# Patient Record
Sex: Male | Born: 1935
Health system: Southern US, Community
[De-identification: ages and names within clinical notes are randomized; demographics above are authoritative.]

---

## 2017-01-06 ENCOUNTER — Emergency Department (HOSPITAL_COMMUNITY): Payer: Medicare Other

## 2017-01-06 ENCOUNTER — Emergency Department (HOSPITAL_COMMUNITY)
Admission: EM | Admit: 2017-01-06 | Discharge: 2017-01-07 | Disposition: A | Discharge status: Home / Self Care | Payer: Medicare Other | Attending: Emergency Medicine | Admitting: Emergency Medicine

## 2017-01-06 DIAGNOSIS — R4182 Altered mental status, unspecified: Secondary | ICD-10-CM | POA: Insufficient documentation

## 2017-01-06 DIAGNOSIS — Y939 Activity, unspecified: Secondary | ICD-10-CM | POA: Insufficient documentation

## 2017-01-06 DIAGNOSIS — T50901A Poisoning by unspecified drugs, medicaments and biological substances, accidental (unintentional), initial encounter: Secondary | ICD-10-CM | POA: Insufficient documentation

## 2017-01-06 DIAGNOSIS — Y999 Unspecified external cause status: Secondary | ICD-10-CM | POA: Insufficient documentation

## 2017-01-06 DIAGNOSIS — Z79899 Other long term (current) drug therapy: Secondary | ICD-10-CM

## 2017-01-06 DIAGNOSIS — W19XXXA Unspecified fall, initial encounter: Secondary | ICD-10-CM | POA: Diagnosis not present

## 2017-01-06 DIAGNOSIS — Y929 Unspecified place or not applicable: Secondary | ICD-10-CM | POA: Diagnosis not present

## 2017-01-06 NOTE — ED Triage Notes (Signed)
[facility] and [facility]. Pt LSN at 2000 by staff; pt found face down on floor. No lacs or bleeding noted. Per EMS pt baseline A&O; hx dementia and DM. Pt snoring; responding to painful stimuli. resp e/u. MD at bedside.

## 2017-01-06 NOTE — ED Notes (Addendum)
Pt given Seroquil, Ativan and Haldol per EMS by facility. Pt started mumbling some words when rectal temperature was taken. Otherwise responsive to pain.

## 2017-01-06 NOTE — ED Provider Notes (Addendum)
[facility]-EMERGENCY DEPT Provider Note   CSN: [id number] Arrival date & time: 01/06/17  2303  By signing my name below, I, Cynda Acres, attest that this documentation has been prepared under the direction and in the presence of Yardley Beltran, MD. Electronically Signed: Cynda Acres, Scribe. 01/06/17. 11:15 PM.  History   Chief Complaint Chief Complaint  Patient presents with  . Altered Mental Status  . Fall   LEVEL 5 CAVEAT DUE TO ALTERED MENTAL STATUS  HPI Comments: Bradley Hawkins is a 82 y.o. male with a history of dementia, who presents to the Emergency Department via EMS, for an unwitnessed mechanical fall that happened earlier today. Patient was last seen normal at 8 PM tonight. Patient is typically alert and at baseline. Per EMS, the patient was found face down on the floor. Per documentation from the facility the patient was given ativan, Seroquel, and haldol. Patient snoring in the room.   The history is provided by the EMS personnel. No language interpreter was used.  Altered Mental Status   This is a new problem. The current episode started 3 to 5 hours ago. The problem has not changed since onset.Associated symptoms include somnolence. Associated symptoms comments: After getting haldol, seroquel, ativan and remeron at the same time.  . His past medical history is significant for dementia and psychotropic medication treatment.  Fall  This is a new problem. The current episode started 3 to 5 hours ago.    No past medical history on file.  There are no active problems to display for this patient.   No past surgical history on file.     Home Medications    Prior to Admission medications   Not on File    Family History No family history on file.  Social History Social History  Substance Use Topics  . Smoking status: Not on file  . Smokeless tobacco: Not on file  . Alcohol use Not on file     Allergies   Patient has no allergy information on record.   Review  of Systems Review of Systems  Unable to perform ROS: Dementia     Physical Exam Updated Vital Signs SpO2 100%   Physical Exam  Constitutional: He appears well-developed and well-nourished. No distress.  HENT:  Head: Normocephalic and atraumatic. Head is without raccoon's eyes and without Battle's sign.  Mouth/Throat: Oropharynx is clear and moist. No oropharyngeal exudate.  No racoon eyes. Skull is stable.   Eyes: Conjunctivae are normal. Pupils are equal, round, and reactive to light.  Neck: Normal range of motion. Neck supple. No JVD present. No tracheal deviation present.  Cardiovascular: Normal rate, regular rhythm, normal heart sounds and intact distal pulses.  Exam reveals no gallop and no friction rub.   No murmur heard. Pulmonary/Chest: Effort normal and breath sounds normal. No stridor. He has no wheezes. He has no rales.  Abdominal: Soft. Bowel sounds are normal. He exhibits no mass. There is no tenderness. There is no rebound and no guarding.  Genitourinary:  Genitourinary Comments: Pelvis is stable.   Musculoskeletal: Normal range of motion. He exhibits no tenderness.  No foreshortening or external rotation. Intact distal pulses.   Lymphadenopathy:    He has no cervical adenopathy.  Neurological: He is alert. He displays normal reflexes. No cranial nerve deficit. He exhibits normal muscle tone. Coordination normal.  Skin: Skin is warm and dry. Capillary refill takes less than 2 seconds.     ED Treatments / Results   Vitals:  01/07/17 0300 01/07/17 0315  BP: 121/61 125/66  Pulse: 73 68  Resp: 12 13  Temp:      DIAGNOSTIC STUDIES: Oxygen Saturation is 100% on RA, normal by my interpretation.    COORDINATION OF CARE: 11:16 PM Discussed treatment plan with pt at bedside and pt agreed to plan, which includes imaging.    Radiology No results found for this or any previous visit. Ct Head Wo Contrast  Result Date: 01/07/2017 CLINICAL DATA:  Ischial  evaluation for acute fall, found down. EXAM: CT HEAD WITHOUT CONTRAST CT CERVICAL SPINE WITHOUT CONTRAST TECHNIQUE: Multidetector CT imaging of the head and cervical spine was performed following the standard protocol without intravenous contrast. Multiplanar CT image reconstructions of the cervical spine were also generated. COMPARISON:  None. FINDINGS: CT HEAD FINDINGS Brain: Generalized age-related cerebral atrophy with mild chronic small vessel ischemic disease. No acute intracranial hemorrhage. No evidence for acute infarct. No mass lesion, midline shift or mass effect. No hydrocephalus. No extra-axial fluid collection. Vascular: No hyperdense vessel. Scattered intracranial atherosclerosis noted. Skull: Scalp soft tissues demonstrate no acute abnormality. Calvarium intact. Sinuses/Orbits: Globes and orbital soft tissues within normal limits. Patient is status post lens extraction bilaterally. Mild scattered mucosal thickening within the frontoethmoidal sinuses. Paranasal sinuses are otherwise clear. No mastoid effusion. Other: No other significant finding. CT CERVICAL SPINE FINDINGS Alignment: Mild levoscoliosis. Trace anterolisthesis of C7 on T1. Vertebral bodies otherwise normally aligned. Skull base and vertebrae: Skullbase intact. Normal C1-2 articulations preserved. Dens is intact. Vertebral body heights maintained. No acute fracture. Soft tissues and spinal canal: Visualized soft tissues of the neck demonstrate no acute abnormality. Prevertebral soft tissues within normal limits. 13 mm right thyroid nodule noted, of doubtful significance. Disc levels: Advanced multilevel degenerative spondylolysis, greatest at C3-4 red area is near complete fusion. Multilevel facet arthropathy, also greatest at C3-4. Upper chest: Visualized upper chest demonstrates no acute abnormality. Irregular biapical pleural thickening. Visualized lungs are otherwise clear. IMPRESSION: CT BRAIN: 1. No acute intracranial process. 2.  Mild age-related cerebral atrophy with chronic small vessel ischemic disease. CT CERVICAL SPINE: 1. No acute traumatic injury within cervical spine. 2. Moderate to advanced multilevel degenerative spondylolysis and facet arthrosis, greatest at C3-4. Electronically Signed   By: Rise Mu M.D.   On: 01/07/2017 00:40   Ct Cervical Spine Wo Contrast  Result Date: 01/07/2017 CLINICAL DATA:  Ischial evaluation for acute fall, found down. EXAM: CT HEAD WITHOUT CONTRAST CT CERVICAL SPINE WITHOUT CONTRAST TECHNIQUE: Multidetector CT imaging of the head and cervical spine was performed following the standard protocol without intravenous contrast. Multiplanar CT image reconstructions of the cervical spine were also generated. COMPARISON:  None. FINDINGS: CT HEAD FINDINGS Brain: Generalized age-related cerebral atrophy with mild chronic small vessel ischemic disease. No acute intracranial hemorrhage. No evidence for acute infarct. No mass lesion, midline shift or mass effect. No hydrocephalus. No extra-axial fluid collection. Vascular: No hyperdense vessel. Scattered intracranial atherosclerosis noted. Skull: Scalp soft tissues demonstrate no acute abnormality. Calvarium intact. Sinuses/Orbits: Globes and orbital soft tissues within normal limits. Patient is status post lens extraction bilaterally. Mild scattered mucosal thickening within the frontoethmoidal sinuses. Paranasal sinuses are otherwise clear. No mastoid effusion. Other: No other significant finding. CT CERVICAL SPINE FINDINGS Alignment: Mild levoscoliosis. Trace anterolisthesis of C7 on T1. Vertebral bodies otherwise normally aligned. Skull base and vertebrae: Skullbase intact. Normal C1-2 articulations preserved. Dens is intact. Vertebral body heights maintained. No acute fracture. Soft tissues and spinal canal: Visualized soft tissues of the neck  demonstrate no acute abnormality. Prevertebral soft tissues within normal limits. 13 mm right thyroid  nodule noted, of doubtful significance. Disc levels: Advanced multilevel degenerative spondylolysis, greatest at C3-4 red area is near complete fusion. Multilevel facet arthropathy, also greatest at C3-4. Upper chest: Visualized upper chest demonstrates no acute abnormality. Irregular biapical pleural thickening. Visualized lungs are otherwise clear. IMPRESSION: CT BRAIN: 1. No acute intracranial process. 2. Mild age-related cerebral atrophy with chronic small vessel ischemic disease. CT CERVICAL SPINE: 1. No acute traumatic injury within cervical spine. 2. Moderate to advanced multilevel degenerative spondylolysis and facet arthrosis, greatest at C3-4. Electronically Signed   By: Rise Mu M.D.   On: 01/07/2017 00:40   Ct Hip Left Wo Contrast  Result Date: 01/07/2017 CLINICAL DATA:  Pain after fall, lucency noted along the left femoral head on same day hip radiographs EXAM: CT OF THE LEFT HIP WITHOUT CONTRAST TECHNIQUE: Multidetector CT imaging of the left hip was performed according to the standard protocol. Multiplanar CT image reconstructions were also generated. COMPARISON:  None. FINDINGS: Bones/Joint/Cartilage There is osteoarthritis of the left hip with subchondral degenerative cysts of the acetabulum and prominent rim of osteophytes at the femoral head- neck juncture simulating the lucency seen radiographically. The lucency is likely due to the cleft at the osteophyte-femoral head juncture. No bone destruction, significant joint effusion or intra-articular loose bodies. Small bone island of the left acetabulum. The visualized left SI joint and pubic rami are nonacute. Ligaments Suboptimally assessed by CT. Muscles and Tendons Negative Soft tissues Negative IMPRESSION: Osteoarthritis of the left hip. Prominent rim of osteophytes likely contributing to the lucency seen radiographically. No fracture or dislocation noted. Electronically Signed   By: Tollie Eth M.D.   On: 01/07/2017 01:30   Dg  Hips Bilat W Or Wo Pelvis 3-4 Views  Result Date: 01/07/2017 CLINICAL DATA:  Fall with bilateral hip pain EXAM: DG HIP (WITH OR WITHOUT PELVIS) 3-4V BILAT COMPARISON:  None. FINDINGS: Mild SI joint arthritis. Both femoral heads project in joint. Pubic symphysis is intact. No definite fracture on the right. Moderate bilateral hip arthritis. There is a lucency at the left femoral head neck junction IMPRESSION: 1. Lucency at the left femoral head neck junction, cannot exclude a subtle nondisplaced fracture. CT recommended for further evaluation 2. No definite acute osseous abnormality of the right hip. 3. Moderate arthritis of both hips Electronically Signed   By: Jasmine Pang M.D.   On: 01/07/2017 00:02    Procedures Procedures (including critical care time)   Patient now easily arousable and appropriate. This is a huge amount of medications for a person even one with dementia and sun downing.  I recommend stopping these meds as the patient is a fall risk.   Final Clinical Impressions(s) / ED Diagnoses  Polypharmacy causing fall and somnolence. Patient has stable vitals. Pertinent labs were available during my care of the patient were reviewed by me and considered in my medical decision making.  After history, exam, and medical workup I feel the patient has been appropriately medically screened and is safe for discharge home. Pertinent diagnoses were discussed with the patient. Patient was given return precautions. Return immediately for fevers, weakness, numbness, persistent somnolence, focal weakness continued intractable nausea and vomiting, shortness of breath, lightheadedness or any concerns. Stop those medications.  Follow up with your PMD for recheck in 2 days.    I personally performed the services described in this documentation, which was scribed in my presence. The recorded information has  been reviewed and is accurate.       Cy BlamerApril Linzee Depaul, MD 01/07/17 [id number]    Cy BlamerApril Trell Secrist,  MD 01/07/17 [phone]

## 2017-01-06 NOTE — ED Notes (Signed)
Patient transported to CT 

## 2017-01-07 ENCOUNTER — Emergency Department (HOSPITAL_COMMUNITY): Payer: Medicare Other

## 2017-01-07 MED ORDER — AMMONIA AROMATIC IN INHA
0.3000 mL | Freq: Once | RESPIRATORY_TRACT | Status: AC
  Administered 2017-01-07: 0.3 mL via RESPIRATORY_TRACT
  Filled 2017-01-07: qty 10

## 2017-01-07 MED ORDER — NALOXONE HCL 2 MG/2ML IJ SOSY: 2.0000 mg | PREFILLED_SYRINGE | Freq: Once | INTRAMUSCULAR | Status: DC

## 2017-01-07 MED ORDER — NALOXONE HCL 2 MG/2ML IJ SOSY
2.0000 mg | PREFILLED_SYRINGE | Freq: Once | INTRAMUSCULAR | Status: AC
  Administered 2017-01-07: 2 mg via INTRAVENOUS
  Filled 2017-01-07: qty 2

## 2017-01-07 NOTE — ED Notes (Signed)
Contacted facility, pt was given 1mg  Haldol, 1mg  Ativan, 75mg  Seroquil and 15mg  Remeron last night.

## 2017-01-07 NOTE — ED Notes (Signed)
[facility] Dur[facility] about [facility]. Family notified.

## 2017-01-07 NOTE — ED Notes (Signed)
Onalee Huaavid, pt's son would like to be contacted with any updates. [phone].

## 2017-01-07 NOTE — ED Notes (Signed)
Pt did not respond to ammonia inhalant.

## 2017-01-07 NOTE — ED Notes (Signed)
Patient transported to CT 

## 2018-05-25 IMAGING — CT CT HEAD W/O CM
3 of 4 series · 14 of 47 positions shown, 16 images · non-contrast
Comparison: None.

CLINICAL DATA: Ischial evaluation for acute fall, found down.

EXAM:
CT HEAD WITHOUT CONTRAST
CT CERVICAL SPINE WITHOUT CONTRAST
TECHNIQUE: Multidetector CT imaging of the head and cervical spine was
performed following the standard protocol without intravenous
contrast. Multiplanar CT image reconstructions of the cervical spine
were also generated.

[Series 3: c_spine 2.0 st · axial · 0.31mm/px · z∈[-331,-145]mm · 8 of 109 slices shown, 10 images]
[im 8/109  brain]
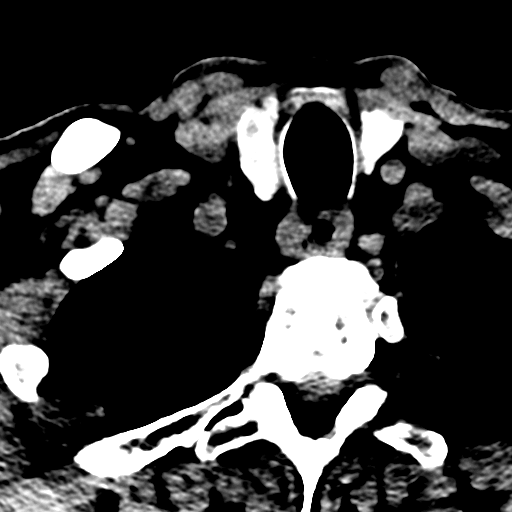
[im 8/109  bone]
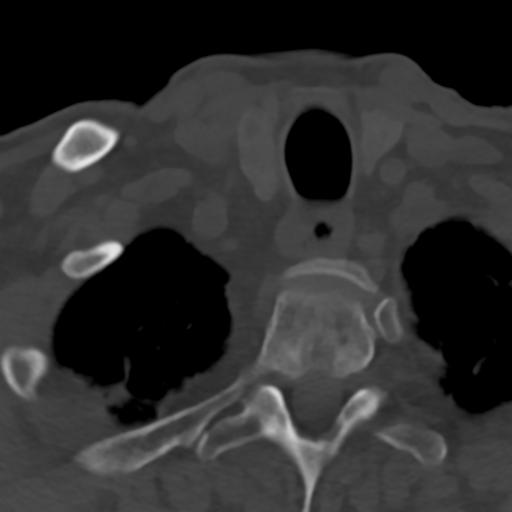
[im 24/109  brain]
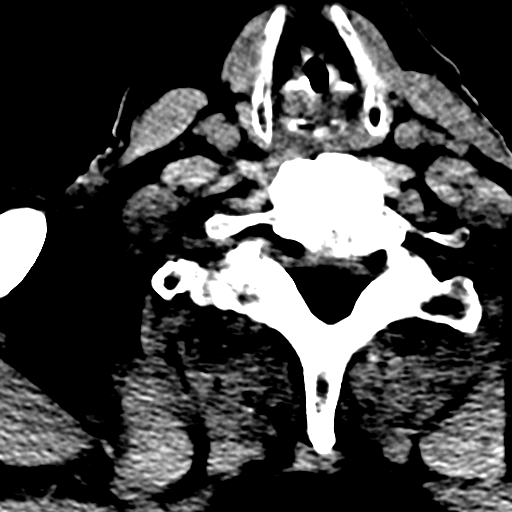
[im 39/109  brain]
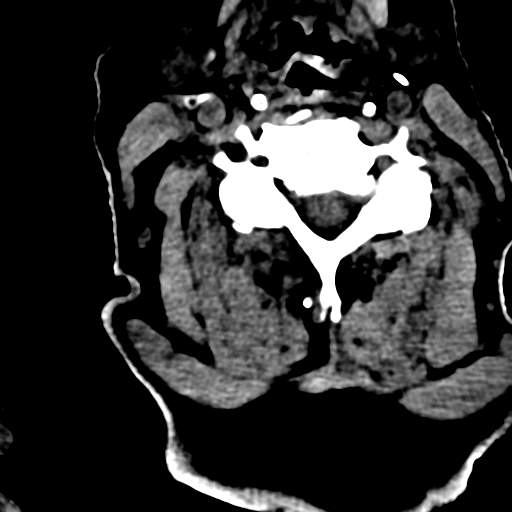
[im 47/109  brain]
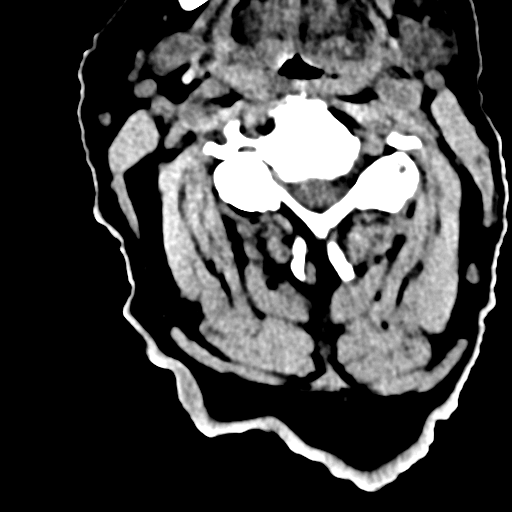
[im 62/109  brain]
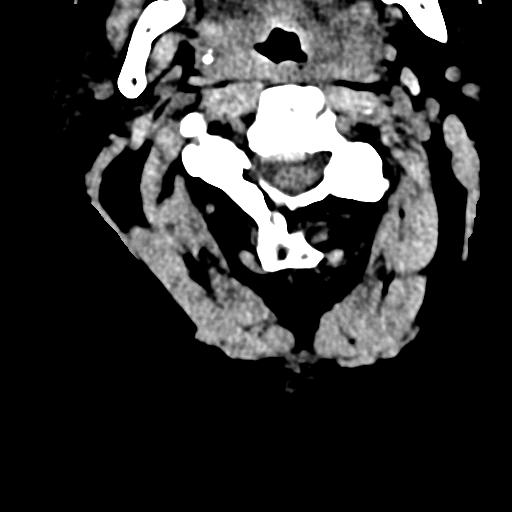
[im 62/109  bone]
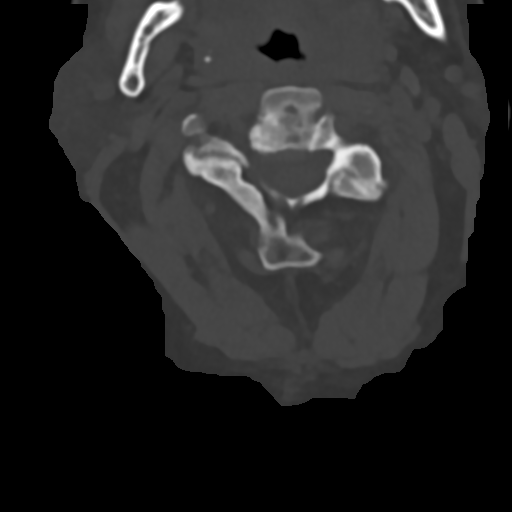
[im 70/109  brain]
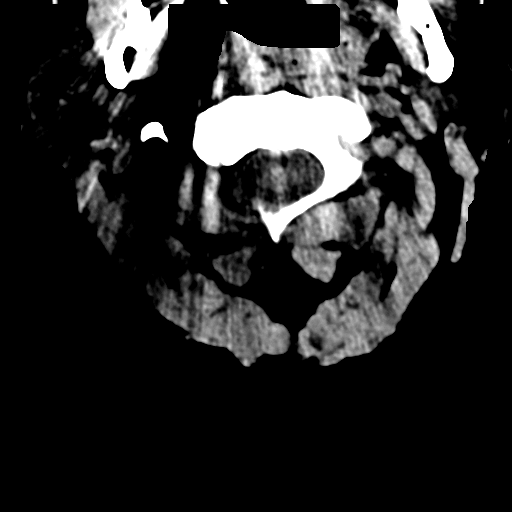
[im 85/109  brain]
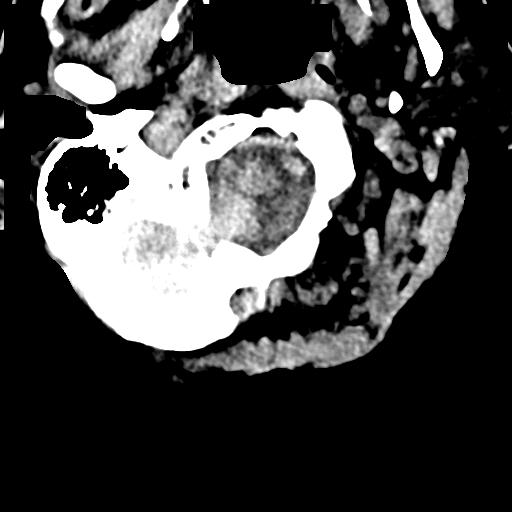
[im 101/109  brain]
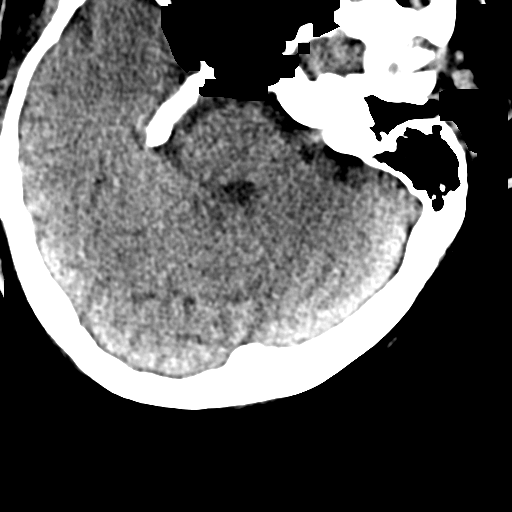

[Series 5: c_spine 2.0 sag bone · sagittal · 0.30mm/px · 3 of 75 slices shown]
[im 25/75  brain]
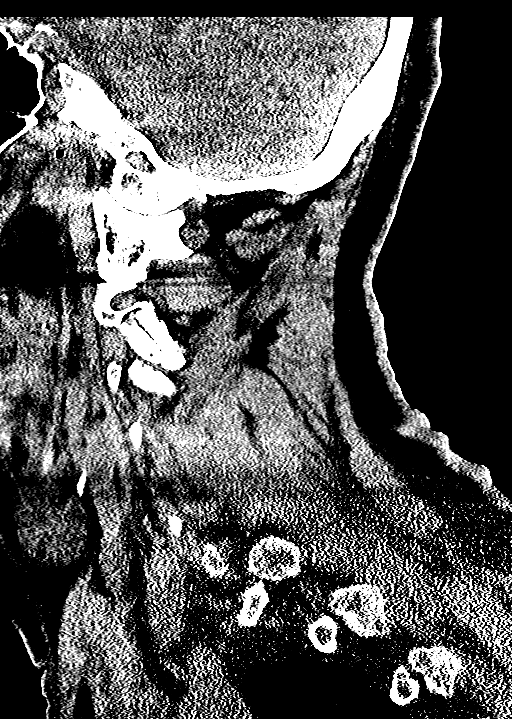
[im 38/75  brain]
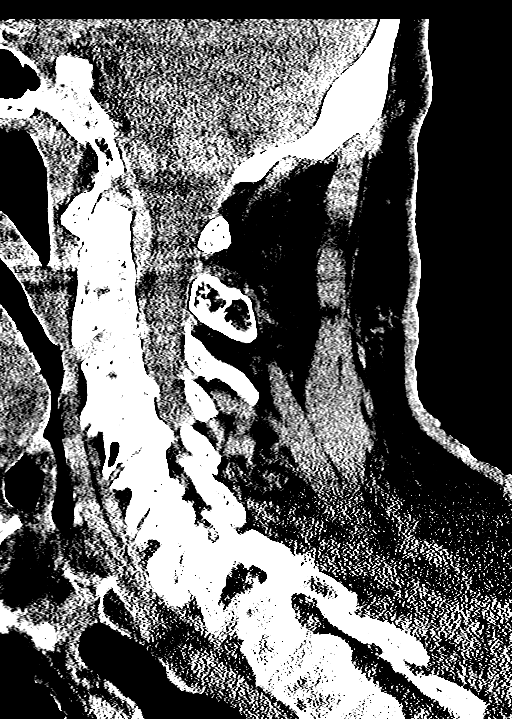
[im 50/75  brain]
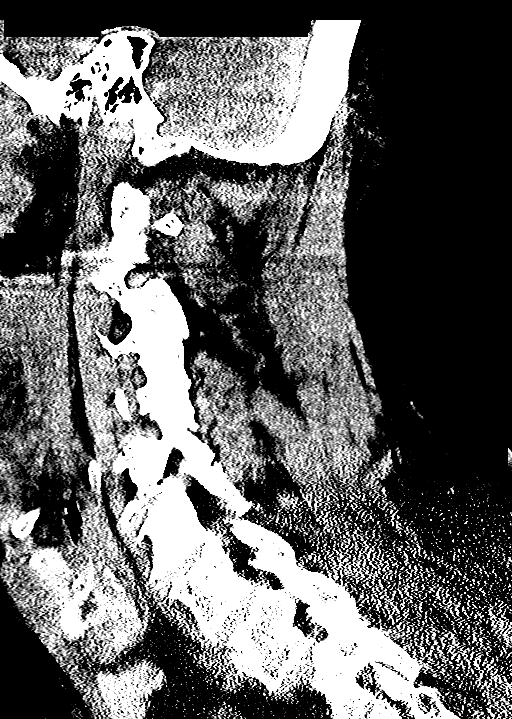

[Series 6: c_spine 2.0 cor bone · coronal · 0.32mm/px · 3 of 71 slices shown]
[im 24/71  brain]
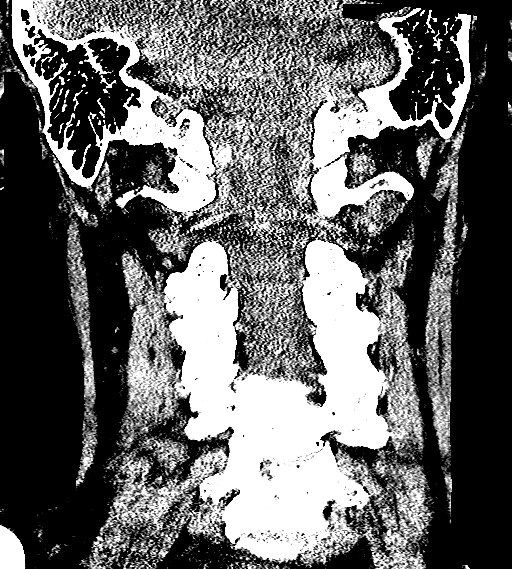
[im 32/71  brain]
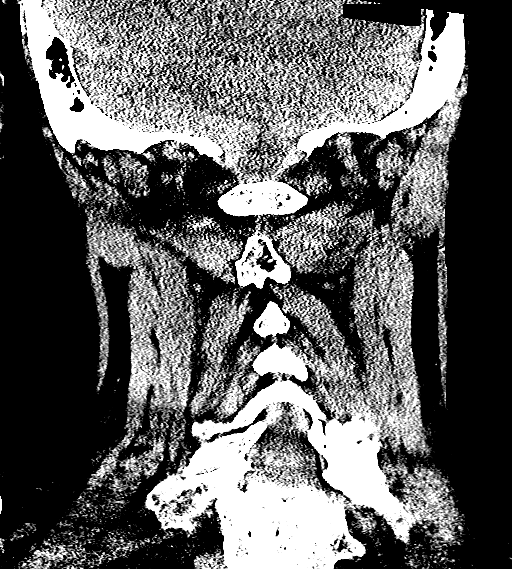
[im 39/71  brain]
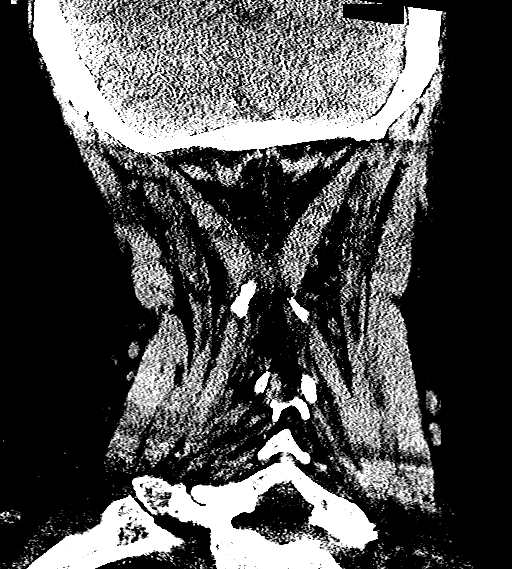

[14 of 47 positions shown; findings below may reference images not displayed]

FINDINGS: CT HEAD FINDINGS

Brain: Generalized age-related cerebral atrophy with mild chronic
small vessel ischemic disease. No acute intracranial hemorrhage. No
evidence for acute infarct. No mass lesion, midline shift or mass
effect. No hydrocephalus. No extra-axial fluid collection.

Vascular: No hyperdense vessel. Scattered intracranial
atherosclerosis noted.

Skull: Scalp soft tissues demonstrate no acute abnormality.
Calvarium intact.

Sinuses/Orbits: Globes and orbital soft tissues within normal
limits. Patient is status post lens extraction bilaterally. Mild
scattered mucosal thickening within the frontoethmoidal sinuses.
Paranasal sinuses are otherwise clear. No mastoid effusion.

Other: No other significant finding.

CT CERVICAL SPINE FINDINGS

Alignment: Mild levoscoliosis. Trace anterolisthesis of C7 on T1.
Vertebral bodies otherwise normally aligned.

Skull base and vertebrae: Skullbase intact. Normal C1-2
articulations preserved. Dens is intact. Vertebral body heights
maintained. No acute fracture.

Soft tissues and spinal canal: Visualized soft tissues of the neck
demonstrate no acute abnormality. Prevertebral soft tissues within
normal limits. 13 mm right thyroid nodule noted, of doubtful
significance.

Disc levels: Advanced multilevel degenerative spondylolysis,
greatest at C3-4 red area is near complete fusion. Multilevel facet
arthropathy, also greatest at C3-4.

Upper chest: Visualized upper chest demonstrates no acute
abnormality. Irregular biapical pleural thickening. Visualized lungs
are otherwise clear.
IMPRESSION: CT BRAIN:

1. No acute intracranial process.
2. Mild age-related cerebral atrophy with chronic small vessel
ischemic disease.

CT CERVICAL SPINE:

1. No acute traumatic injury within cervical spine.
2. Moderate to advanced multilevel degenerative spondylolysis and
facet arthrosis, greatest at C3-4.

## 2021-09-30 DEATH — deceased
